# Patient Record
Sex: Male | Born: 1985 | Race: White | Hispanic: No | Marital: Single | Smoking: Never smoker
Health system: Southern US, Community
[De-identification: ages and names within clinical notes are randomized; demographics above are authoritative.]

## PROBLEM LIST (undated history)

## (undated) DIAGNOSIS — G43909 Migraine, unspecified, not intractable, without status migrainosus: Secondary | ICD-10-CM

## (undated) DIAGNOSIS — F32A Depression, unspecified: Secondary | ICD-10-CM

## (undated) DIAGNOSIS — B019 Varicella without complication: Secondary | ICD-10-CM

## (undated) DIAGNOSIS — F419 Anxiety disorder, unspecified: Secondary | ICD-10-CM

## (undated) DIAGNOSIS — M1990 Unspecified osteoarthritis, unspecified site: Secondary | ICD-10-CM

## (undated) DIAGNOSIS — F329 Major depressive disorder, single episode, unspecified: Secondary | ICD-10-CM

## (undated) DIAGNOSIS — K219 Gastro-esophageal reflux disease without esophagitis: Secondary | ICD-10-CM

## (undated) HISTORY — DX: Major depressive disorder, single episode, unspecified: F32.9

## (undated) HISTORY — DX: Migraine, unspecified, not intractable, without status migrainosus: G43.909

## (undated) HISTORY — DX: Varicella without complication: B01.9

## (undated) HISTORY — DX: Depression, unspecified: F32.A

## (undated) HISTORY — DX: Unspecified osteoarthritis, unspecified site: M19.90

## (undated) HISTORY — PX: CHOLECYSTECTOMY: SHX55

## (undated) HISTORY — DX: Anxiety disorder, unspecified: F41.9

## (undated) HISTORY — DX: Gastro-esophageal reflux disease without esophagitis: K21.9

---

## 2010-11-30 ENCOUNTER — Ambulatory Visit (INDEPENDENT_AMBULATORY_CARE_PROVIDER_SITE_OTHER): Payer: BC Managed Care – PPO | Admitting: Family Medicine

## 2010-11-30 ENCOUNTER — Encounter: Payer: Self-pay | Admitting: Family Medicine

## 2010-11-30 DIAGNOSIS — G43909 Migraine, unspecified, not intractable, without status migrainosus: Secondary | ICD-10-CM

## 2010-11-30 DIAGNOSIS — F419 Anxiety disorder, unspecified: Secondary | ICD-10-CM

## 2010-11-30 DIAGNOSIS — F411 Generalized anxiety disorder: Secondary | ICD-10-CM

## 2010-11-30 MED ORDER — SUMATRIPTAN SUCCINATE 100 MG PO TABS: 100.0000 mg | ORAL_TABLET | ORAL | Status: DC | PRN

## 2010-11-30 MED ORDER — CLONAZEPAM 1 MG PO TABS: 1.0000 mg | ORAL_TABLET | Freq: Two times a day (BID) | ORAL | Status: DC | PRN

## 2010-11-30 NOTE — Progress Notes (Signed)
  Subjective:    Patient ID: Justin Mcconnell, male    DOB: [birth year], 25 y.o.   MRN: [id number]  HPI New patient to establish care.  Past medical history reviewed. 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 He has history of chronic anxiety, possibly generalized anxiety. Has seen psychiatrist in the past. Currently low-dose Effexor 37.5 mg daily and Klonopin 0.5 mg 4 times daily. He was started on Effexor about 2 months ago. Still some breakthrough symptoms. No specific stressors. No history of panic disorder. Reports previous use of multiple medications including Prozac, Zoloft, and Lexapro but none worked well.  History migraine headaches. Infrequent episodes. Imitrex works well and patient needs refills  History of GERD. Uses Protonix 1 daily and has had consistent breakthrough symptoms when he discontinues. Multiple drug allergies as outlined.  Previous cholecystectomy around age 39. Family history significant for mother with breast cancer. Follow history of hypertension and stroke. Father with type 2 diabetes  Nonsmoker. No alcohol use.   Review of Systems  Constitutional: Negative for fever, activity change, appetite change and fatigue.  HENT: Negative for ear pain, congestion and trouble swallowing.   Eyes: Negative for pain and visual disturbance.  Respiratory: Negative for cough, shortness of breath and wheezing.   Cardiovascular: Negative for chest pain and palpitations.  Gastrointestinal: Negative for nausea, vomiting, abdominal pain, diarrhea, constipation, blood in stool, abdominal distention and rectal pain.  Genitourinary: Negative for dysuria, hematuria and testicular pain.  Musculoskeletal: Negative for joint swelling and arthralgias.  Skin: Negative for rash.  Neurological: Negative for dizziness, syncope and headaches.  Hematological: Negative for adenopathy.  Psychiatric/Behavioral: Negative for confusion and dysphoric mood.       Objective:   Physical Exam  Constitutional: He is oriented to person, place, and time. He appears well-developed and well-nourished. No distress.  Cardiovascular: Normal rate, regular rhythm and normal heart sounds.   No murmur heard. Pulmonary/Chest: Effort normal and breath sounds normal. No respiratory distress. He has no wheezes.  Musculoskeletal: He exhibits no edema.  Neurological: He is alert and oriented to person, place, and time.  Psychiatric: He has a normal mood and affect. His behavior is normal. Judgment and thought content normal.          Assessment & Plan:  #1 chronic anxiety, question generalized anxiety. For simplicity change Klonopin to 1 mg twice daily. He's been on multiple serotonin drugs without much improvement. Could also consider titrating Effexor but he had some sweats possibly associated with low-dose #2 history of migraine headaches. Refill Imitrex for one year. These are infrequent and would not justify prophylactic treatment at this time #3 history of GERD well controlled on Protonix. Diet discussed. Routine followup 3 months

## 2010-12-04 ENCOUNTER — Ambulatory Visit (INDEPENDENT_AMBULATORY_CARE_PROVIDER_SITE_OTHER): Payer: BC Managed Care – PPO | Admitting: Family Medicine

## 2010-12-04 ENCOUNTER — Telehealth: Payer: Self-pay | Admitting: *Deleted

## 2010-12-04 ENCOUNTER — Encounter: Payer: Self-pay | Admitting: Family Medicine

## 2010-12-04 VITALS — BP 102/84 | Temp 98.8°F | Ht 72.0 in | Wt 259.0 lb

## 2010-12-04 DIAGNOSIS — F419 Anxiety disorder, unspecified: Secondary | ICD-10-CM

## 2010-12-04 DIAGNOSIS — F329 Major depressive disorder, single episode, unspecified: Secondary | ICD-10-CM

## 2010-12-04 DIAGNOSIS — F341 Dysthymic disorder: Secondary | ICD-10-CM

## 2010-12-04 NOTE — Telephone Encounter (Signed)
Per Dr Caryl Never, increase Klonipin to 1mg  to 3 times a day, every 8 hours.  No refills given at this time as pt was instructed to schedule a follow-up visit within the next few days.  Pt potentially has a new job today and cannot stay to be seen at this time.

## 2010-12-04 NOTE — Patient Instructions (Signed)
Taper off Effexor. We will call you regarding appointment with psychiatrist.

## 2010-12-04 NOTE — Telephone Encounter (Signed)
Pt here for OV later today to discuss

## 2010-12-04 NOTE — Telephone Encounter (Signed)
Pt only wants to speak to Dr Caryl Never re: his Effexor.  Having severe side effects....Marland Kitchenmemory loss, sweating all day and night, cannot concentrated.  Needs help.

## 2010-12-04 NOTE — Progress Notes (Signed)
  Subjective:    Patient ID: Justin Mcconnell, male    DOB: 10-17-1985, 25 y.o.   MRN: [id number]  HPI Patient seen for follow up with progressive anxiety symptoms over the weekend. Refer to prior note. Patient has had prior evaluation through [facility] (in [place]). Recently placed on Effexor XR 75 mg daily and Klonopin. He feels anxiety symptoms are worsening. This medication was initiated 20th day of June of this year. He also relates prior treatment with Celexa, Lexapro, Prozac, Paxil, and Zoloft and none of those worked well for him. He now relates on today's visit question of bipolar diagnosed previously. Also states father was diagnosed with bipolar years ago.  Increased sweats with Effexor. Taking Klonopin 1 mg twice a day and this does help minimally. He had periods of depression and also relates feeling somewhat agitated at times and sometimes impulsive type behaviors. Sometimes goes without sleep and doesn't seem to miss that. Frequently feels irritable and argumentative. No suicidal ideation. No history of drug abuse  Past Medical History  Diagnosis Date  . Arthritis   . Anxiety   . Depression   . GERD (gastroesophageal reflux disease)   . Migraines   . Chicken pox    Past Surgical History  Procedure Date  . Cholecystectomy     reports that he has never smoked. He does not have any smokeless tobacco history on file. His alcohol and drug histories not on file. family history includes Alcohol abuse in his maternal grandmother; Cancer in his mother; Diabetes in his maternal grandmother; Heart disease in his father; Hypertension in his father; Mental illness in his brother, father, maternal grandmother, and mother; and Stroke in his father. Allergies  Allergen Reactions  . Clindamycin/Lincomycin Other (See Comments)    Bloody stool  . Amoxicillin     Blood in stool  . Ultram (Tramadol Hcl)     swelling     Review of Systems  Respiratory:  Negative for cough and shortness of breath.   Cardiovascular: Negative for chest pain.  Neurological: Negative for headaches.  Psychiatric/Behavioral: Positive for sleep disturbance and dysphoric mood. Negative for suicidal ideas, hallucinations, confusion and self-injury. The patient is nervous/anxious.        Objective:   Physical Exam  Constitutional: He appears well-developed and well-nourished.  Cardiovascular: Normal rate, regular rhythm and normal heart sounds.   Pulmonary/Chest: Effort normal and breath sounds normal. No respiratory distress. He has no wheezes. He has no rales.  Psychiatric: His behavior is normal. Thought content normal.          Assessment & Plan:  History of anxiety and depression. Concern for possible bipolar disorder. Mood disorder questionnaire administered and positive screen. Concern is whether mania triggered by recent initiation of Effexor. Taper off Effexor. Referral to psychiatrist and patient will need to be on mood stabilizer mosr likely. Explained rationale for leaving off antidepressants at this time

## 2010-12-04 NOTE — Telephone Encounter (Signed)
[facility] Triage Call Report Triage Record Num: [id number] Operator: Albertine Grates Patient Name: Justin Mcconnell Call Date & Time: 12/03/2010 10:49:56PM Patient Phone: [phone] PCP: Evelena Peat Patient Gender: Male PCP Fax : [phone] Patient DOB: [birth year] [facility] Name: [facility] Reason for Call: Has been taking Effexor since ^-20. Has noticed decrease in memory since began taking. Wakes up "at odd hours" sweating. States cannot "sit still". States med causes him to "jittery". Feels more anxious. Denies suicidal tendencies. Has taken Clonazepam and helps. Advised to follow up with MD 7-9. Protocol(s) Used: Anxiety Recommended Outcome per Protocol: See Provider within 24 hours Reason for Outcome: Feeling overwhelmed AND unable to calm down Care Advice: ~ 12/03/2010 10:58:59PM Page 1 of 1 [id number]

## 2010-12-05 ENCOUNTER — Telehealth: Payer: Self-pay | Admitting: *Deleted

## 2010-12-05 NOTE — Telephone Encounter (Signed)
VM from pt, the psych referral group require $150.00 up front, the group is in his network, still requiring $ and he cannot afford this.  See note on desk from Corky Mull in referral order printed on your desk

## 2010-12-06 MED ORDER — OLANZAPINE 10 MG PO TABS: 10.0000 mg | ORAL_TABLET | Freq: Every day | ORAL | Status: DC

## 2010-12-06 MED ORDER — CLONAZEPAM 1 MG PO TABS: 1.0000 mg | ORAL_TABLET | Freq: Three times a day (TID) | ORAL | Status: DC | PRN

## 2010-12-06 NOTE — Telephone Encounter (Signed)
He still needs to see psychiatrist.  Given delay in seeing psychiatrist, start Zyprexa 10 mg po qd.  He may continue with clonazepam.

## 2010-12-06 NOTE — Telephone Encounter (Signed)
Rx called in, pt aware.  Psyc will not schedule an appt until he pays the $150.00.  He will get paid Monday, he will pay the $150 and then schedule appt.  Pt seemed a bit calmer with this phone conversation. FYI

## 2010-12-06 NOTE — Telephone Encounter (Signed)
Pt called several times expressing his frustrations with cost of psych referral and wait time to see Dr Emerson Monte.  She cannot see his as a new pt until the end of July.  Also they require a $150.00 up front to make appt.  Pt states he is too depressed to wait.  Per Dr Caryl Never, we can suggest going to ER for evaluation and directives, or short term could put him on medication.  I spoke with pt, he does not want to incur cost of an ER visit, agreed to pay $150.00 and schedule his appt with Dr Ann Maki as discussed, as long as Dr Caryl Never will help with meds intermittently.  Increase Clonazepam to TID, will need refill called to his pharmacy as he will run out early.

## 2010-12-07 ENCOUNTER — Telehealth: Payer: Self-pay | Admitting: Family Medicine

## 2010-12-07 NOTE — Telephone Encounter (Signed)
[facility] Pharmacist called re: pts script for clonazePAM (KLONOPIN) 1 MG tablet . Pt is req this be filled today, but pt rcvd #60 on 11/30/10. Is this ok to fill early?

## 2010-12-07 NOTE — Telephone Encounter (Signed)
Allyson, [facility]ist at [facility] and pt informed Dr Caryl Never has approved early refill of Clonazepam.  Pt did confirm taking one tab 3 times a day and plans to pick up refill on the 20th.  He was polite with this phone encounter.

## 2010-12-07 NOTE — Telephone Encounter (Signed)
Pt came to office angry the [facility] would not fill his new Rx for Klonopin 1 mg, one tab 3 times a day.  It was explained to pt this is a controlled medicine, and even if he takes the Rx filled on 7/5, #60 pills, he should have enough to last through 7/25.  I spoke with [facility] Pharmacist Allyson who states she can fill controlled meds 5 days early with OK from prescribing provider. She also said pt offered to pay cash for the med.  Pt states the problem is transportation, he is borrowing a car and waisting gas running all over the place.  He may not have transportation on 7/25??? Do you want to approve an early refill for Klonopin on 7/15?  Pt requested a copy of OV notes from Monday 7/9.  Authorization to release medical records signed, pt given copy of 7/9 record as requested.  Pt states he is going to file a Theatre manager with [facility] and [facility].

## 2010-12-07 NOTE — Telephone Encounter (Signed)
This patient needs to see a psychiatrist now.  I strongly suspect he has Bipolar Disorder and has had some agitation as evidenced by some our phone interactions over the past couple of days likely indicative of manic phase.   He was recently initiated on antidepressant by another practice and this likely precipitated manic flare.  I know he has cost issues and if he can't wait until co-pay to see private psychiatrist other option is evaluation by [facility] though I suspect he will by unwilling to go at this time.  We can only refill his Clonazepam if he is due to run out after increasing his dose to TID few days ago.  If he is exceeding dose of 1 mg TID cannot refill early.

## 2011-03-02 ENCOUNTER — Ambulatory Visit: Payer: BC Managed Care – PPO | Admitting: Family Medicine

## 2011-09-24 ENCOUNTER — Emergency Department (HOSPITAL_BASED_OUTPATIENT_CLINIC_OR_DEPARTMENT_OTHER)
Admission: EM | Admit: 2011-09-24 | Discharge: 2011-09-24 | Disposition: A | Discharge status: Home / Self Care | Payer: BC Managed Care – PPO | Attending: Emergency Medicine | Admitting: Emergency Medicine

## 2011-09-24 ENCOUNTER — Emergency Department (INDEPENDENT_AMBULATORY_CARE_PROVIDER_SITE_OTHER): Payer: BC Managed Care – PPO

## 2011-09-24 ENCOUNTER — Encounter (HOSPITAL_BASED_OUTPATIENT_CLINIC_OR_DEPARTMENT_OTHER): Payer: Self-pay | Admitting: *Deleted

## 2011-09-24 DIAGNOSIS — M25539 Pain in unspecified wrist: Secondary | ICD-10-CM

## 2011-09-24 DIAGNOSIS — M7989 Other specified soft tissue disorders: Secondary | ICD-10-CM | POA: Insufficient documentation

## 2011-09-24 DIAGNOSIS — IMO0002 Reserved for concepts with insufficient information to code with codable children: Secondary | ICD-10-CM | POA: Insufficient documentation

## 2011-09-24 DIAGNOSIS — M79609 Pain in unspecified limb: Secondary | ICD-10-CM | POA: Insufficient documentation

## 2011-09-24 DIAGNOSIS — K219 Gastro-esophageal reflux disease without esophagitis: Secondary | ICD-10-CM | POA: Insufficient documentation

## 2011-09-24 DIAGNOSIS — F341 Dysthymic disorder: Secondary | ICD-10-CM | POA: Insufficient documentation

## 2011-09-24 DIAGNOSIS — Z79899 Other long term (current) drug therapy: Secondary | ICD-10-CM | POA: Insufficient documentation

## 2011-09-24 DIAGNOSIS — M79643 Pain in unspecified hand: Secondary | ICD-10-CM

## 2011-09-24 DIAGNOSIS — M129 Arthropathy, unspecified: Secondary | ICD-10-CM | POA: Insufficient documentation

## 2011-09-24 LAB — DIFFERENTIAL
Basophils Absolute: 0 10*3/uL (ref 0.0–0.1)
Basophils Relative: 1 % (ref 0–1)
Eosinophils Relative: 1 % (ref 0–5)
Monocytes Absolute: 0.8 10*3/uL (ref 0.1–1.0)
Neutro Abs: 5.7 10*3/uL (ref 1.7–7.7)

## 2011-09-24 LAB — CBC
HCT: 45.3 % (ref 39.0–52.0)
MCHC: 35.5 g/dL (ref 30.0–36.0)
Platelets: 225 10*3/uL (ref 150–400)
RDW: 13.5 % (ref 11.5–15.5)
WBC: 8.9 10*3/uL (ref 4.0–10.5)

## 2011-09-24 MED ORDER — KETOROLAC TROMETHAMINE 30 MG/ML IJ SOLN
30.0000 mg | Freq: Once | INTRAMUSCULAR | Status: AC
  Administered 2011-09-24: 30 mg via INTRAVENOUS
  Filled 2011-09-24: qty 1

## 2011-09-24 MED ORDER — OXYCODONE-ACETAMINOPHEN 5-325 MG PO TABS
2.0000 | ORAL_TABLET | Freq: Once | ORAL | Status: AC
  Administered 2011-09-24: 2 via ORAL
  Filled 2011-09-24: qty 2

## 2011-09-24 MED ORDER — IBUPROFEN 600 MG PO TABS: 600.0000 mg | ORAL_TABLET | Freq: Four times a day (QID) | ORAL | Status: AC | PRN

## 2011-09-24 MED ORDER — OXYCODONE-ACETAMINOPHEN 5-325 MG PO TABS: 2.0000 | ORAL_TABLET | ORAL | Status: AC | PRN

## 2011-09-24 NOTE — ED Notes (Signed)
Pain and swelling in his right hand for the past 2 days. States last week he hit a fan with same hand. Healing abrasion to the radial aspect of his right hand. Pain is on the ulnar side of his right hand. Red, swollen and hot to touch.

## 2011-09-24 NOTE — Discharge Instructions (Signed)
Wear your splint for comfort. Take your medication as prescribed. Follow up with your doctor as discussed.  Arthralgia Your caregiver has diagnosed you as suffering from an arthralgia. Arthralgia means there is pain in a joint. This can come from many reasons including:  Bruising the joint which causes soreness (inflammation) in the joint.   Wear and tear on the joints which occur as we grow older (osteoarthritis).   Overusing the joint.   Various forms of arthritis.   Infections of the joint.  Regardless of the cause of pain in your joint, most of these different pains respond to anti-inflammatory drugs and rest. The exception to this is when a joint is infected, and these cases are treated with antibiotics, if it is a bacterial infection. HOME CARE INSTRUCTIONS   Rest the injured area for as long as directed by your caregiver. Then slowly start using the joint as directed by your caregiver and as the pain allows. Crutches as directed may be useful if the ankles, knees or hips are involved. If the knee was splinted or casted, continue use and care as directed. If an stretchy or elastic wrapping bandage has been applied today, it should be removed and re-applied every 3 to 4 hours. It should not be applied tightly, but firmly enough to keep swelling down. Watch toes and feet for swelling, bluish discoloration, coldness, numbness or excessive pain. If any of these problems (symptoms) occur, remove the ace bandage and re-apply more loosely. If these symptoms persist, contact your caregiver or return to this location.   For the first 24 hours, keep the injured extremity elevated on pillows while lying down.   Apply ice for 15 to 20 minutes to the sore joint every couple hours while awake for the first half day. Then 3 to 4 times per day for the first 48 hours. Put the ice in a plastic bag and place a towel between the bag of ice and your skin.   Wear any splinting, casting, elastic bandage  applications, or slings as instructed.   Only take over-the-counter or prescription medicines for pain, discomfort, or fever as directed by your caregiver. Do not use aspirin immediately after the injury unless instructed by your physician. Aspirin can cause increased bleeding and bruising of the tissues.   If you were given crutches, continue to use them as instructed and do not resume weight bearing on the sore joint until instructed.  Persistent pain and inability to use the sore joint as directed for more than 2 to 3 days are warning signs indicating that you should see a caregiver for a follow-up visit as soon as possible. Initially, a hairline fracture (break in bone) may not be evident on X-rays. Persistent pain and swelling indicate that further evaluation, non-weight bearing or use of the joint (use of crutches or slings as instructed), or further X-rays are indicated. X-rays may sometimes not show a small fracture until a week or 10 days later. Make a follow-up appointment with your own caregiver or one to whom we have referred you. A radiologist (specialist in reading X-rays) may read your X-rays. Make sure you know how you are to obtain your X-ray results. Do not assume everything is normal if you do not hear from [place]. SEEK MEDICAL CARE IF: Bruising, swelling, or pain increases. SEEK IMMEDIATE MEDICAL CARE IF:   Your fingers or toes are numb or blue.   The pain is not responding to medications and continues to stay the same or  get worse.   The pain in your joint becomes severe.   You develop a fever over 102 F (38.9 C).   It becomes impossible to move or use the joint.  MAKE SURE YOU:   Understand these instructions.   Will watch your condition.   Will get help right away if you are not doing well or get worse.  Document Released: 05/14/2005 Document Revised: 05/03/2011 Document Reviewed: 12/31/2007 [facility] Patient Information 2012 [place], [place].  Pain Medicine  Instructions You have been given a prescription for pain medicines. These medicines may affect your ability to think clearly. They may also affect your ability to perform physical activities. Take these medicines only as needed for pain. You do not need to take them if you are not having pain, unless directed by your caregiver. You can take less than the prescribed dose if you find a smaller amount of medicine controls the pain. It may not be possible to make all of your pain go away, but you should be comfortable enough to move, breathe, and take care of yourself. After you start taking pain medicines, while taking the medicines, and for 8 hours after stopping the medicines:  Do not drive.   Do not operate machinery.   Do not operate power tools.   Do not sign legal documents.   Do not supervise children by yourself.   Do not participate in activities that require climbing or being in high places.   Do not enter a body of water (lake, river, ocean, spa, swimming pool) without an adult nearby who can help you.  You may have been prescribed a pain medicine that contains acetaminophen (paracetamol). If so, take only the amount directed by your caregiver. Do not take any other acetaminophen while taking this medicine. An overdose of acetaminophen can result in severe liver damage. If you are taking other medicines, check the active ingredients for acetaminophen. Acetaminophen is found in hundreds of over-the-counter and prescription medicines. These include cold relief products, menstrual cramp relief medicines, fever-reducing medicines, acid indigestion relief products, and pain relief products. HOME CARE INSTRUCTIONS   Do not drink alcohol, take sleeping pills, or take other medicines until at least 8 hours after your last dose of pain medicine, or as directed by your caregiver.   Use a bulk stool softener if you become constipated from your pain medicines. Increasing your intake of fruits and  vegetables will also help.   Write down the times when you take your medicines. Look at the times before taking your next dose of medicine. It is easy to become confused while on pain medicines. Recording the times helps you to avoid an overdose.  SEEK MEDICAL CARE IF:  Your medicine is not helping the pain go away.   You vomit or have diarrhea shortly after taking the medicine.   You develop new pain in areas that did not hurt before.  SEEK IMMEDIATE MEDICAL CARE IF:  You feel dizzy or faint.   You feel there are other problems that might be caused by your medicine.  MAKE SURE YOU:   Understand these instructions.   Will watch your condition.   Will get help right away if you are not doing well or get worse.  Document Released: 08/20/2000 Document Revised: 05/03/2011 Document Reviewed: 04/28/2010 [facility] Patient Information 2012 [place], [place]. RESOURCE GUIDE  Dental Problems  Patients with Medicaid: [facility]  [facility] [address]                                           [phone] [address] [employer] Phone:  [phone]                                                   Phone:  [phone]  If unable to pay or uninsured, contact:  [facility] or [facility]. to become qualified for the adult dental clinic.  Chronic Pain Problems Contact [facility]  [phone] Patients need to be referred by their primary care doctor.  Insufficient Money for Medicine Contact [employer]:  call "211" or [facility] 337-027-8116.  No Primary Care Doctor Call [facility]  267-234-2136 Other agencies that provide inexpensive medical care    [facility]  [phone]    [facility]  [phone]    [facility]  [phone]    [facility]  [phone]    [facility]  9858376536    [facility]  [phone]  Psychological Services [facility]   [phone] [facility]  [phone] [facility]   [phone] (emergency services [phone])  Abuse/Neglect [facility] Child Abuse Hotline 628 312 7679 [facility] Child Abuse Hotline 551-216-7046 (After Hours)  Emergency Shelter [facility] [phone]  [facility] 254-501-3591 [facility] [phone]  MRSA Hotline #:   579-373-8061    [facility] Resources  [facility]  [employer]                           [facility] Dept. [address]                     [address]         [address]  Blondell Reveal Phone:  [phone]                                  Phone:  [phone]                   Phone:  [phone]  [facility] Phone:  [phone]  [facility] Child Abuse Hotline [phone] (After Hours)

## 2011-09-24 NOTE — ED Provider Notes (Signed)
History     CSN: [id number]  Arrival date & time 09/24/11  1406   First MD Initiated Contact with Patient 09/24/11 1505      No chief complaint on file.   (Consider location/radiation/quality/duration/timing/severity/associated sxs/prior treatment) HPI  Presents with 2 days of right hand pain. Describes as constant, worse with movement and palpation. Currently 8/10. Has not taken anything for pain at home today. Took aleve last night without relief. Waking him up at night. Denies fever, chills. States that he hit dorsolateral aspect of his hand one week ago and abrasions and swelling present at that site which is different from dorsomedial aspect of hand where current pain persists. Denies numbness/tingling/weakness of extremity. No h/o gout. Denies hematuria/dysuria/freq/urgency. Denies rash, denies recent travel. Feels similar to his arthritis which usually affects wrists, back, knees.  ED Notes, ED Provider Notes from 09/24/11 0000 to 09/24/11 14:18:04       Julieanne Manson, RN 09/24/2011 14:16      Pain and swelling in his right hand for the past 2 days. States last week he hit a fan with same hand. Healing abrasion to the radial aspect of his right hand. Pain is on the ulnar side of his right hand. Red, swollen and hot to touch.      Past Medical History  Diagnosis Date  . Arthritis   . Anxiety   . Depression   . GERD (gastroesophageal reflux disease)   . Migraines   . Chicken pox     Past Surgical History  Procedure Date  . Cholecystectomy     Family History  Problem Relation Age of Onset  . Mental illness Mother   . Cancer Mother     breast  . Mental illness Father   . Heart disease Father   . Stroke Father   . Hypertension Father   . Mental illness Brother   . Mental illness Maternal Grandmother   . Diabetes Maternal Grandmother   . Alcohol abuse Maternal Grandmother     History  Substance Use Topics  . Smoking status: Never Smoker   . Smokeless  tobacco: Not on file  . Alcohol Use: No    Review of Systems  All other systems reviewed and are negative.  except as noted HPI   Allergies  Clindamycin/lincomycin; Amoxicillin; and Ultram  Home Medications   Current Outpatient Rx  Name Route Sig Dispense Refill  . NAPROXEN SODIUM 220 MG PO TABS Oral Take 440 mg by mouth 2 (two) times daily with a meal. Patient used this medication for his hand pain.    . IBUPROFEN 600 MG PO TABS Oral Take 1 tablet (600 mg total) by mouth every 6 (six) hours as needed for pain. 30 tablet 0  . OXYCODONE-ACETAMINOPHEN 5-325 MG PO TABS Oral Take 2 tablets by mouth every 4 (four) hours as needed for pain. 6 tablet 0  . SUMATRIPTAN SUCCINATE 100 MG PO TABS Oral Take 1 tablet (100 mg total) by mouth every 2 (two) hours as needed. 10 tablet 11    BP 146/81  Pulse 89  Temp(Src) 98.2 F (36.8 C) (Oral)  Resp 16  SpO2 99%  Physical Exam  Nursing note and vitals reviewed. Constitutional: He is oriented to person, place, and time. He appears well-developed and well-nourished. No distress.  HENT:  Head: Atraumatic.  Mouth/Throat: Oropharynx is clear and moist.  Eyes: Conjunctivae are normal. Pupils are equal, round, and reactive to light.  Neck: Neck supple.  Cardiovascular: Normal rate,  regular rhythm, normal heart sounds and intact distal pulses.  Exam reveals no gallop and no friction rub.   No murmur heard. Pulmonary/Chest: Effort normal. No respiratory distress. He has no wheezes. He has no rales.  Abdominal: Soft. Bowel sounds are normal. There is no tenderness. There is no rebound and no guarding.  Musculoskeletal: He exhibits no edema and no tenderness.       Arms: Neurological: He is alert and oriented to person, place, and time.  Skin: Skin is warm and dry.  Psychiatric: He has a normal mood and affect.    ED Course  Procedures (including critical care time)   Labs Reviewed  CBC  DIFFERENTIAL   Dg Hand Complete Right  09/24/2011   *RADIOLOGY REPORT*  Clinical Data: Pain and swelling.  RIGHT HAND - COMPLETE 3+ VIEW  Comparison: None.  Findings: Three-view exam shows no fracture.  No subluxation or dislocation.  No worrisome lytic or sclerotic osseous abnormality.  IMPRESSION: No acute bony findings.  Original Report Authenticated By: ERIC A. MANSELL, M.D.     1. Hand pain   2. Wrist pain     MDM  Hand and wrist pain. Feels similar to his arthritis. No evidence of infection at this time. Pain improved with toradol and percocet. Home with percocet and ibuprofen. No EMC precluding discharge at this time. Given Precautions for return. PMD f/u.         Forbes Cellar, MD 09/26/11 [phone]

## 2011-12-19 ENCOUNTER — Other Ambulatory Visit: Payer: Self-pay | Admitting: Family Medicine

## 2011-12-20 ENCOUNTER — Telehealth: Payer: Self-pay | Admitting: Family Medicine

## 2011-12-20 NOTE — Telephone Encounter (Signed)
Patient called stating that he need a refill of his imitrex and protonix called into [facility] on [address]. Please assist.

## 2012-02-16 ENCOUNTER — Other Ambulatory Visit: Payer: Self-pay | Admitting: Family Medicine

## 2012-05-13 ENCOUNTER — Encounter (HOSPITAL_COMMUNITY): Payer: Self-pay | Admitting: *Deleted

## 2012-05-13 ENCOUNTER — Emergency Department (HOSPITAL_COMMUNITY)
Admission: EM | Admit: 2012-05-13 | Discharge: 2012-05-13 | Disposition: A | Discharge status: Home / Self Care | Payer: BC Managed Care – PPO | Attending: Emergency Medicine | Admitting: Emergency Medicine

## 2012-05-13 DIAGNOSIS — Z8619 Personal history of other infectious and parasitic diseases: Secondary | ICD-10-CM | POA: Insufficient documentation

## 2012-05-13 DIAGNOSIS — Z8719 Personal history of other diseases of the digestive system: Secondary | ICD-10-CM | POA: Insufficient documentation

## 2012-05-13 DIAGNOSIS — M6740 Ganglion, unspecified site: Secondary | ICD-10-CM | POA: Insufficient documentation

## 2012-05-13 DIAGNOSIS — M25539 Pain in unspecified wrist: Secondary | ICD-10-CM | POA: Insufficient documentation

## 2012-05-13 DIAGNOSIS — Z8659 Personal history of other mental and behavioral disorders: Secondary | ICD-10-CM | POA: Insufficient documentation

## 2012-05-13 DIAGNOSIS — Z8679 Personal history of other diseases of the circulatory system: Secondary | ICD-10-CM | POA: Insufficient documentation

## 2012-05-13 DIAGNOSIS — Z8739 Personal history of other diseases of the musculoskeletal system and connective tissue: Secondary | ICD-10-CM | POA: Insufficient documentation

## 2012-05-13 MED ORDER — IBUPROFEN 600 MG PO TABS: 600.0000 mg | ORAL_TABLET | Freq: Three times a day (TID) | ORAL | Status: AC | PRN

## 2012-05-13 NOTE — ED Notes (Signed)
NP at bedside for eval

## 2012-05-13 NOTE — ED Provider Notes (Signed)
Pt requests rx ibuprofen at d/c.   Suzi Roots, MD 05/13/12 [phone]

## 2012-05-13 NOTE — ED Notes (Signed)
Pt dc to home.  Pt states understanding to dc instructions.  Pt states will f/u with pcp as directed.

## 2012-05-13 NOTE — ED Notes (Signed)
C/o R posterior wrist cyst/ knot/ swelling. Noticed tonight, c/o pain. Denies other sx. "thinks it is a ganglion cyst". Pt frequently uses keyboard.

## 2012-06-05 NOTE — ED Provider Notes (Signed)
Medical screening examination/treatment/procedure(s) were performed by non-physician practitioner and as supervising physician I was immediately available for consultation/collaboration.   Suzi Roots, MD 06/05/12 [phone]

## 2012-06-05 NOTE — ED Provider Notes (Addendum)
History     CSN: [id number]  Arrival date & time 05/13/12  8295   First MD Initiated Contact with Patient 05/13/12 0254      Chief Complaint  Patient presents with  . Cyst  . Wrist Pain    (Consider location/radiation/quality/duration/timing/severity/associated sxs/prior treatment) HPI Comments: Noted a small painful lump on wrist tonight this it may be a ganglion cyst         The history is provided by the patient.    Past Medical History  Diagnosis Date  . Arthritis   . Anxiety   . Depression   . GERD (gastroesophageal reflux disease)   . Migraines   . Chicken pox     Past Surgical History  Procedure Date  . Cholecystectomy     Family History  Problem Relation Age of Onset  . Mental illness Mother   . Cancer Mother     breast  . Mental illness Father   . Heart disease Father   . Stroke Father   . Hypertension Father   . Mental illness Brother   . Mental illness Maternal Grandmother   . Diabetes Maternal Grandmother   . Alcohol abuse Maternal Grandmother     History  Substance Use Topics  . Smoking status: Never Smoker   . Smokeless tobacco: Not on file  . Alcohol Use: No      Review of Systems  Musculoskeletal: Negative for joint swelling.  Skin: Negative for rash and wound.  All other systems reviewed and are negative.    Allergies  Clindamycin/lincomycin; Amoxicillin; and Ultram  Home Medications   Current Outpatient Rx  Name  Route  Sig  Dispense  Refill  . IBUPROFEN 600 MG PO TABS   Oral   Take 1 tablet (600 mg total) by mouth every 8 (eight) hours as needed for pain. Take with food.   20 tablet   0     BP 146/105  Temp 98.5 F (36.9 C) (Oral)  Resp 18  SpO2 100%  Physical Exam  Nursing note and vitals reviewed. Eyes: Pupils are equal, round, and reactive to light.  Neck: Normal range of motion.  Musculoskeletal: Normal range of motion.  Neurological: He is alert.  Skin: Skin is warm.       Small firm nodule  over the tendon sheath R Wrist     ED Course  INCISION AND DRAINAGE Date/Time: 06/10/2012 11:13 PM Performed by: Arman Filter Authorized by: Arman Filter Consent given by: patient Patient understanding: patient states understanding of the procedure being performed Patient identity confirmed: verbally with patient Time out: Immediately prior to procedure a "time out" was called to verify the correct patient, procedure, equipment, support staff and site/side marked as required. Type: cyst Body area: upper extremity Location details: right wrist Anesthesia: local infiltration Local anesthetic: lidocaine 1% without epinephrine Anesthetic total: 1 ml Patient sedated: no Needle gauge: 22 Patient tolerance: Patient tolerated the procedure well with no immediate complications. Comments: Needle aspiration of gel like substance   (including critical care time)  Labs Reviewed - No data to display No results found.   1. Ganglion cyst of flexor tendon sheath       MDM          Arman Filter, NP 06/05/12 6213  Arman Filter, NP 06/10/12 2314

## 2012-06-12 NOTE — ED Provider Notes (Signed)
Medical screening examination/treatment/procedure(s) were performed by non-physician practitioner and as supervising physician I was immediately available for consultation/collaboration.   Ellanora Rayborn E Nathan Moctezuma, MD 06/12/12 0842 

## 2012-07-12 ENCOUNTER — Other Ambulatory Visit: Payer: Self-pay

## 2013-04-02 ENCOUNTER — Other Ambulatory Visit: Payer: Self-pay

## 2013-08-11 IMAGING — CR DG HAND COMPLETE 3+V*R*
3 series · 3 of 3 positions shown · non-contrast
Comparison: None.

CLINICAL DATA: Pain and swelling.

RIGHT HAND - COMPLETE 3+ VIEW

[x hand pa right]
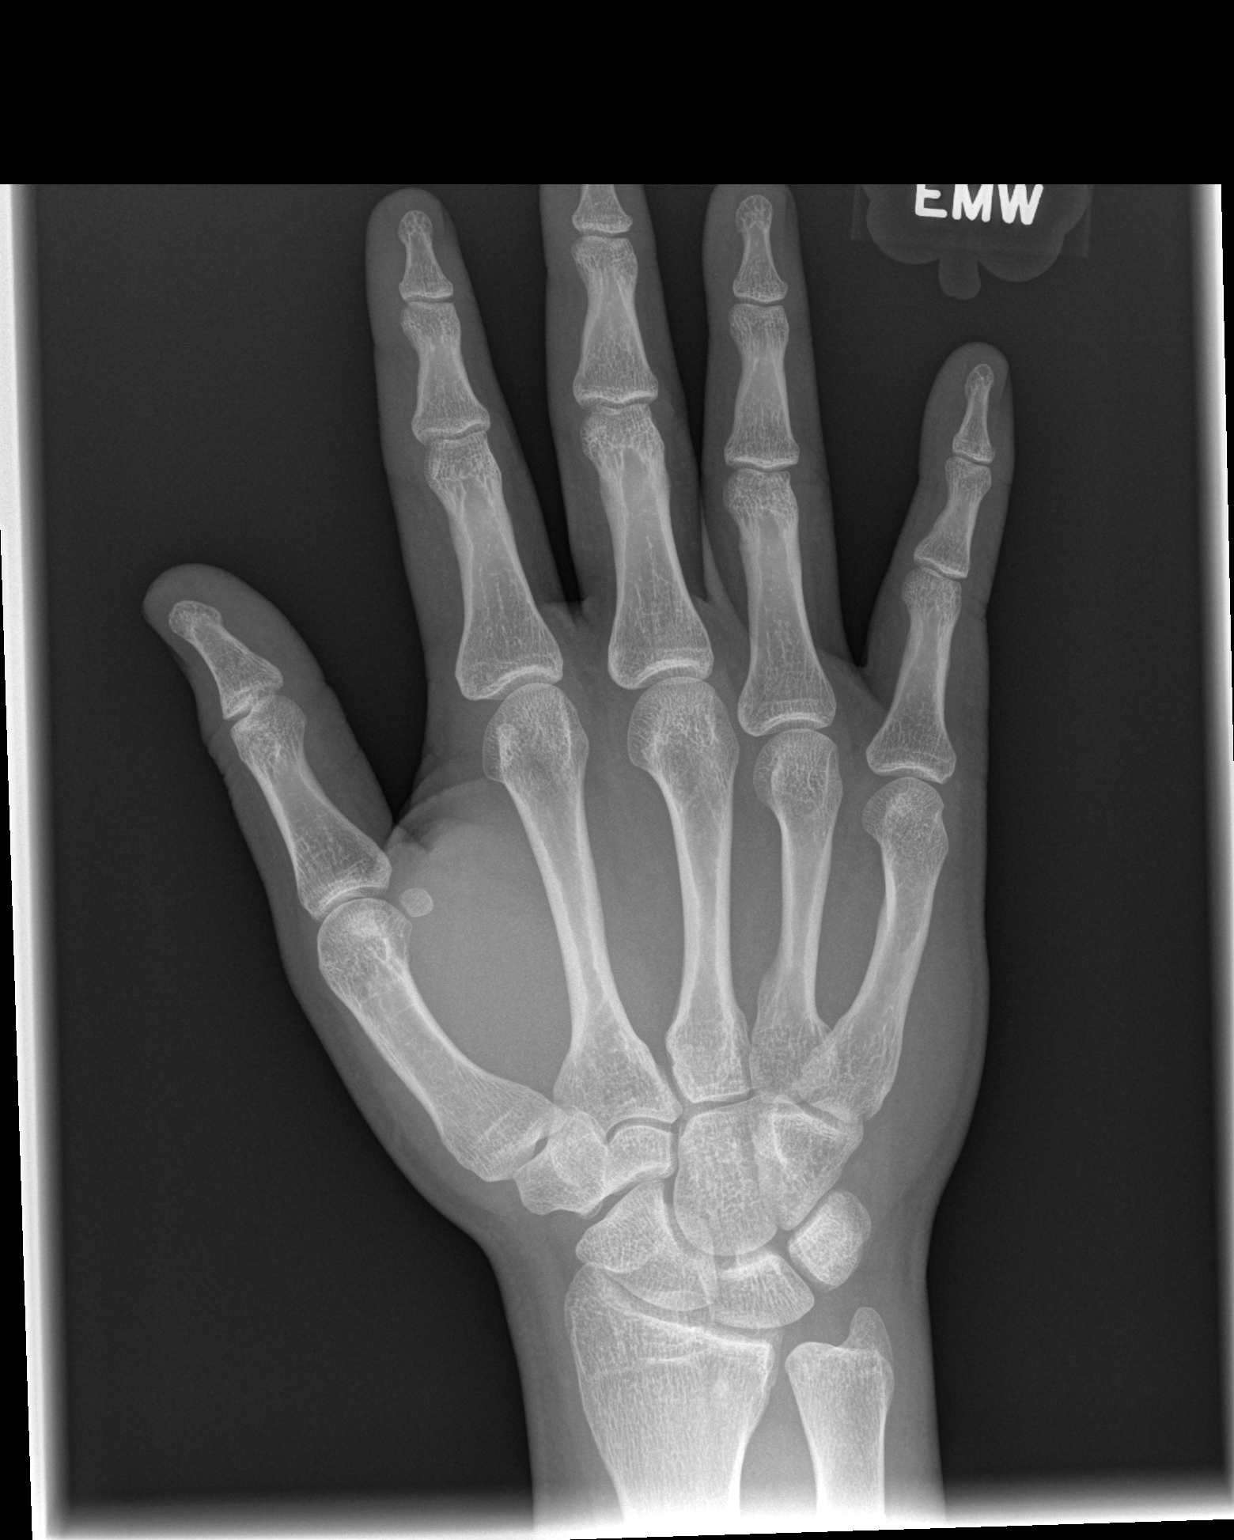

[x hand oblique right]
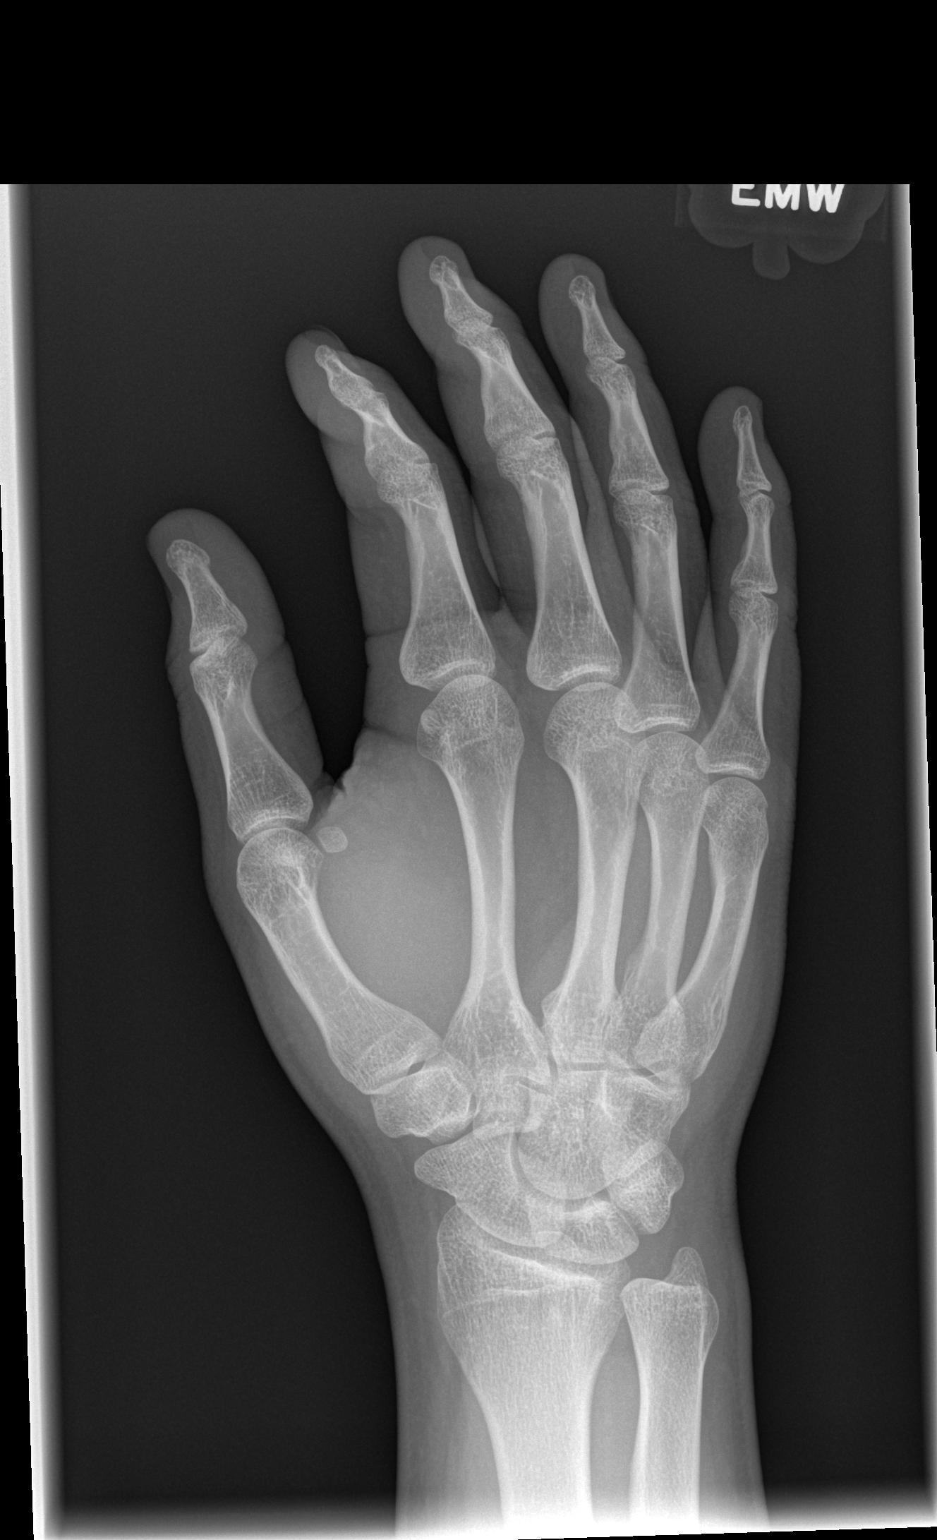

[x hand lat right]
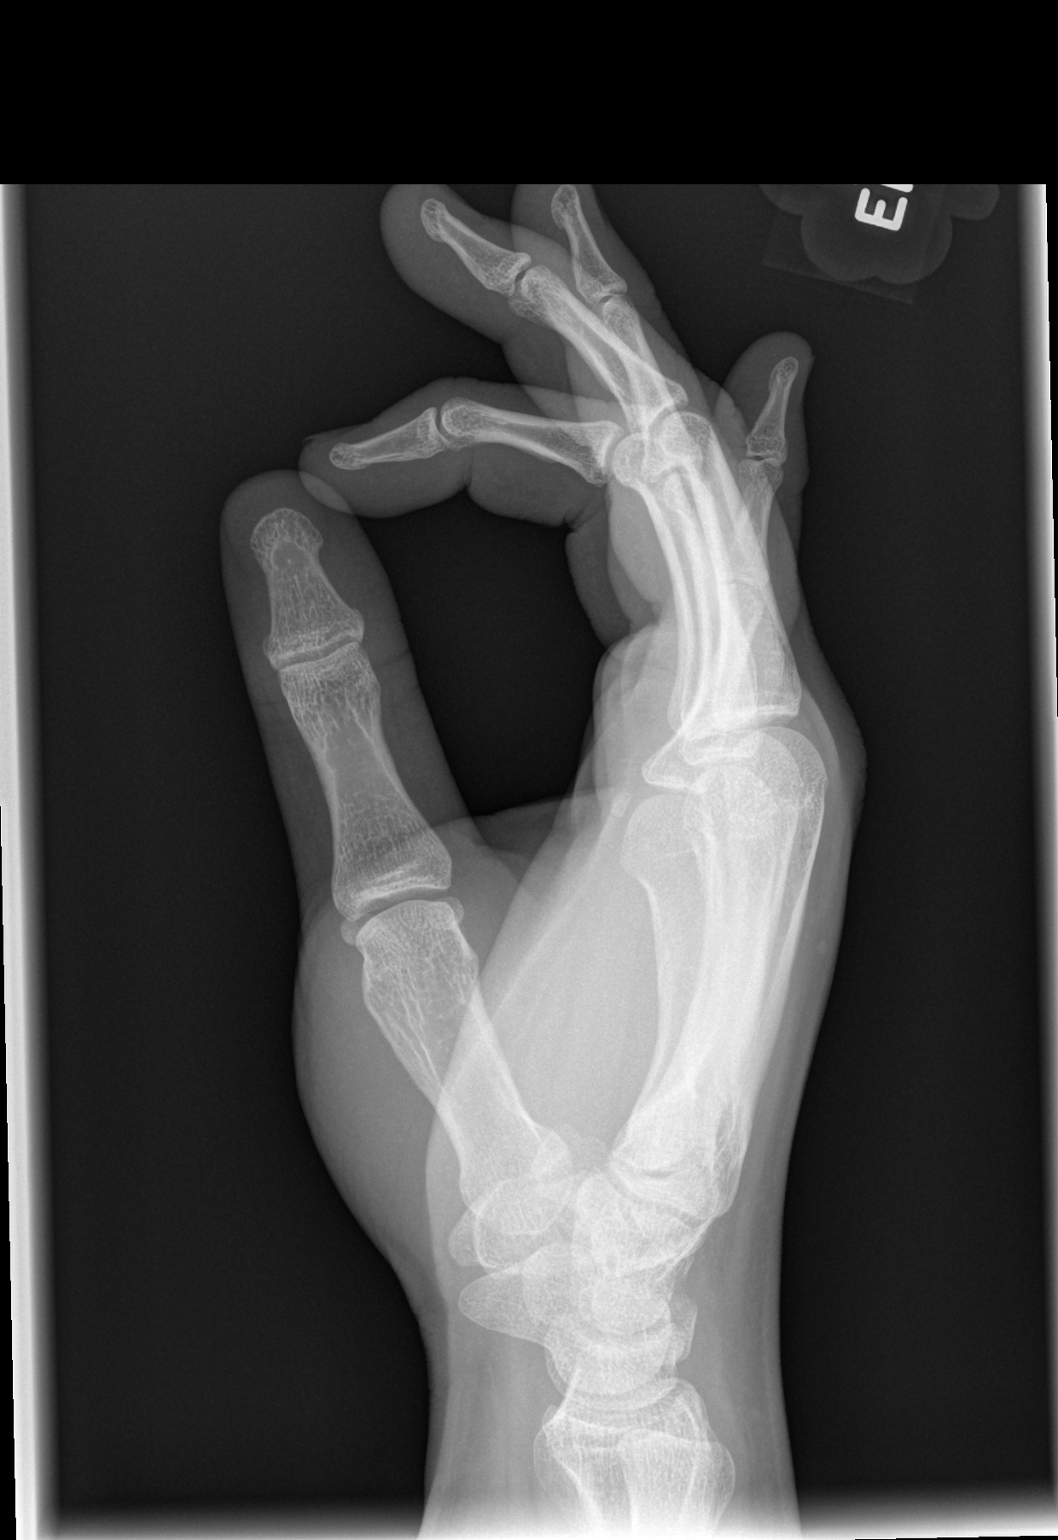

[3 of 3 positions shown; findings below may reference images not displayed]

FINDINGS: Three-view exam shows no fracture.  No subluxation or
dislocation.  No worrisome lytic or sclerotic osseous abnormality.
IMPRESSION: No acute bony findings.

## 2017-02-14 ENCOUNTER — Encounter: Payer: Self-pay | Admitting: Family Medicine

## 2020-12-17 ENCOUNTER — Other Ambulatory Visit: Payer: Self-pay

## 2020-12-17 ENCOUNTER — Encounter (HOSPITAL_COMMUNITY): Payer: Self-pay | Admitting: Emergency Medicine

## 2020-12-17 ENCOUNTER — Emergency Department (HOSPITAL_COMMUNITY)
Admission: EM | Admit: 2020-12-17 | Discharge: 2020-12-17 | Disposition: A | Discharge status: Home / Self Care | Payer: Self-pay | Attending: Emergency Medicine | Admitting: Emergency Medicine

## 2020-12-17 DIAGNOSIS — Z202 Contact with and (suspected) exposure to infections with a predominantly sexual mode of transmission: Secondary | ICD-10-CM | POA: Insufficient documentation

## 2020-12-17 DIAGNOSIS — K6289 Other specified diseases of anus and rectum: Secondary | ICD-10-CM | POA: Insufficient documentation

## 2020-12-17 NOTE — Discharge Instructions (Addendum)
Follow-up in your MyChart account for your test results.  If your tests are positive, follow-up with your primary care provider or health department for further treatment.

## 2020-12-17 NOTE — ED Triage Notes (Signed)
Patient thinks that he has an std. He has been trying to go to health department but cant get an appt.

## 2020-12-17 NOTE — ED Provider Notes (Signed)
[facility]-EMERGENCY DEPT Provider Note   CSN: [id number] Arrival date & time: 12/17/20  1951     History Chief Complaint  Patient presents with   SEXUALLY TRANSMITTED DISEASE    Justin Mcconnell is a 35 y.o. male.  35 year old male presents with concern for STD.  Patient states that he has rectal discomfort and urinary discomfort similar to when he had a prior STD.  Patient is sexually active and needs testing of both urine and rectum. No abdominal pain, urethral discharge, fevers.       Past Medical History:  Diagnosis Date   Anxiety    Arthritis    Chicken pox    Depression    GERD (gastroesophageal reflux disease)    Migraines     Patient Active Problem List   Diagnosis Date Noted   Anxiety and depression 12/04/2010    Past Surgical History:  Procedure Laterality Date   CHOLECYSTECTOMY         Family History  Problem Relation Age of Onset   Mental illness Mother    Cancer Mother        breast   Mental illness Father    Heart disease Father    Stroke Father    Hypertension Father    Mental illness Brother    Mental illness Maternal Grandmother    Diabetes Maternal Grandmother    Alcohol abuse Maternal Grandmother     Social History   Tobacco Use   Smoking status: Never   Smokeless tobacco: Never  Vaping Use   Vaping Use: Never used  Substance Use Topics   Alcohol use: No   Drug use: No    Home Medications Prior to Admission medications   Medication Sig Start Date End Date Taking? Authorizing Provider  ibuprofen (ADVIL,MOTRIN) 600 MG tablet Take 1 tablet (600 mg total) by mouth every 8 (eight) hours as needed for pain. Take with food. 05/13/12   Cathren Laine, MD    Allergies    Amoxicillin, Clindamycin/lincomycin, Clindamycin, Tramadol, and Ultram [tramadol hcl]  Review of Systems   Review of Systems  Constitutional:  Negative for fever.  Gastrointestinal:  Negative for anal bleeding and blood in stool.   Genitourinary:  Negative for dysuria, penile discharge, penile pain, penile swelling, scrotal swelling and testicular pain.  Musculoskeletal:  Negative for arthralgias and myalgias.  Skin:  Negative for rash and wound.  Allergic/Immunologic: Negative for immunocompromised state.  Hematological:  Negative for adenopathy.  Psychiatric/Behavioral:  Negative for confusion.   All other systems reviewed and are negative.  Physical Exam Updated Vital Signs BP (!) 138/98 (BP Location: Right Arm)   Pulse 99   Temp 98.8 F (37.1 C) (Oral)   Resp 17   Ht 6' (1.829 m)   Wt 90.7 kg   SpO2 99%   BMI 27.12 kg/m   Physical Exam Vitals and nursing note reviewed.  Constitutional:      General: He is not in acute distress.    Appearance: He is well-developed. He is not diaphoretic.  HENT:     Head: Normocephalic and atraumatic.  Pulmonary:     Effort: Pulmonary effort is normal.  Skin:    General: Skin is warm and dry.     Findings: No erythema or rash.  Neurological:     Mental Status: He is alert and oriented to person, place, and time.  Psychiatric:        Behavior: Behavior normal.    ED Results / Procedures /  Treatments   Labs (all labs ordered are listed, but only abnormal results are displayed) Labs Reviewed  GC/CHLAMYDIA PROBE AMP ([place]) NOT AT [facility]  GC/CHLAMYDIA PROBE AMP ([place]) NOT AT [facility]    EKG None  Radiology No results found.  Procedures Procedures   Medications Ordered in ED Medications - No data to display  ED Course  I have reviewed the triage vital signs and the nursing notes.  Pertinent labs & imaging results that were available during my care of the patient were reviewed by me and considered in my medical decision making (see chart for details).  Clinical Course as of 12/17/20 2136  Sat Dec 17, 2020  2713 35 year old male with complaint of urethral rectal discomfort with concern for STD.  Plan is to send urine sample, patient will  self collect rectal swab.  Patient to follow-up in MyChart results and present to PCP or health department for treatment. [LM]    Clinical Course User Index [LM] Alden Hipp   MDM Rules/Calculators/A&P                           Final Clinical Impression(s) / ED Diagnoses Final diagnoses:  Possible exposure to STD    Rx / DC Orders ED Discharge Orders     None        Alden Hipp 12/17/20 2136    Linwood Dibbles, MD 12/19/20 1017

## 2020-12-19 LAB — GC/CHLAMYDIA PROBE AMP (~~LOC~~) NOT AT ARMC
Chlamydia: NEGATIVE
Chlamydia: POSITIVE — AB
Comment: NEGATIVE
Comment: NORMAL
Comment: NEGATIVE
Comment: NORMAL
Neisseria Gonorrhea: NEGATIVE
Neisseria Gonorrhea: NEGATIVE
